# Patient Record
Sex: Female | Born: 2000 | Race: White | Hispanic: No | Marital: Single | Smoking: Never smoker
Health system: Southern US, Community
[De-identification: ages and names within clinical notes are randomized; demographics above are authoritative.]

## PROBLEM LIST (undated history)

## (undated) DIAGNOSIS — R55 Syncope and collapse: Secondary | ICD-10-CM

## (undated) HISTORY — PX: FOOT SURGERY: SHX648

## (undated) HISTORY — DX: Syncope and collapse: R55

---
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
## 2022-01-15 ENCOUNTER — Emergency Department
Admission: EM | Admit: 2022-01-15 | Discharge: 2022-01-15 | Disposition: A | Discharge status: Home / Self Care | Payer: 59 | Attending: Emergency Medicine | Admitting: Emergency Medicine

## 2022-01-15 ENCOUNTER — Other Ambulatory Visit: Payer: Self-pay

## 2022-01-15 ENCOUNTER — Encounter: Payer: Self-pay | Admitting: Emergency Medicine

## 2022-01-15 DIAGNOSIS — F10929 Alcohol use, unspecified with intoxication, unspecified: Secondary | ICD-10-CM

## 2022-01-15 DIAGNOSIS — F10120 Alcohol abuse with intoxication, uncomplicated: Secondary | ICD-10-CM | POA: Insufficient documentation

## 2022-01-15 DIAGNOSIS — Y908 Blood alcohol level of 240 mg/100 ml or more: Secondary | ICD-10-CM | POA: Diagnosis not present

## 2022-01-15 LAB — CBC
HCT: 37.5 % (ref 36.0–46.0)
Hemoglobin: 12.5 g/dL (ref 12.0–15.0)
MCH: 28.2 pg (ref 26.0–34.0)
MCHC: 33.3 g/dL (ref 30.0–36.0)
MCV: 84.5 fL (ref 80.0–100.0)
Platelets: 388 10*3/uL (ref 150–400)
RBC: 4.44 MIL/uL (ref 3.87–5.11)
RDW: 12 % (ref 11.5–15.5)
WBC: 9.3 10*3/uL (ref 4.0–10.5)
nRBC: 0 % (ref 0.0–0.2)

## 2022-01-15 LAB — COMPREHENSIVE METABOLIC PANEL
ALT: 18 U/L (ref 0–44)
AST: 20 U/L (ref 15–41)
Albumin: 4.2 g/dL (ref 3.5–5.0)
Alkaline Phosphatase: 51 U/L (ref 38–126)
Anion gap: 9 (ref 5–15)
BUN: 11 mg/dL (ref 6–20)
CO2: 22 mmol/L (ref 22–32)
Calcium: 8.4 mg/dL — ABNORMAL LOW (ref 8.9–10.3)
Chloride: 112 mmol/L — ABNORMAL HIGH (ref 98–111)
Creatinine, Ser: 0.69 mg/dL (ref 0.44–1.00)
GFR, Estimated: >60 mL/min (ref >60)
Glucose, Bld: 95 mg/dL (ref 70–99)
Potassium: 3.6 mmol/L (ref 3.5–5.1)
Sodium: 143 mmol/L (ref 135–145)
Total Bilirubin: 0.3 mg/dL (ref 0.3–1.2)
Total Protein: 7.3 g/dL (ref 6.5–8.1)

## 2022-01-15 LAB — ETHANOL: Alcohol, Ethyl (B): 262 mg/dL — ABNORMAL HIGH (ref <10)

## 2022-01-15 NOTE — Discharge Instructions (Signed)
You were seen in the emergency department for alcohol intoxication.  Underage and/or binge drinking represents a very real risk to your health and your future.  If you find that you cannot stop drinking, consider talking with [facility] about any resources they may have available.  Never drive a vehicle or operate machinery while intoxicated.  Return to the emergency department if you develop new or worsening symptoms that concern you. ?

## 2022-01-15 NOTE — ED Notes (Signed)
Provider at bedside to evaluate.

## 2022-01-15 NOTE — ED Provider Notes (Signed)
? ?[facility] ?Provider Note ? ? ? Event Date/Time  ? First MD Initiated Contact with Patient 01/15/22 0245   ?  (approximate) ? ? ?History  ? ?Alcohol Intoxication ? ? ?HPI ? ?Michelle Wiley is a 21 y.o. female with no contributory chronic medical history who is currently a Consulting civil engineer at [employer].  She presents by private vehicle for evaluation of alcohol intoxication.  The patient is awake and alert and oriented.  She reports that she was at a bar on campus tonight drinking.  She laughs and says she drank "way too much".  She then began to vomit, so her roommate became concerned and brought her to the emergency department.  She said that she goes out regularly but does not always drink heavily.  She says she feels fine now and is no longer nauseated.  She is awake, alert, oriented, and in no distress.  No falls or other injuries. ?  ? ? ?Physical Exam  ? ?Triage Vital Signs: ?ED Triage Vitals  ?Enc Vitals Group  ?   BP 01/15/22 0216 129/84  ?   Pulse Rate 01/15/22 0216 93  ?   Resp 01/15/22 0216 16  ?   Temp 01/15/22 0216 98.8 ?F (37.1 ?C)  ?   Temp Source 01/15/22 0216 Oral  ?   SpO2 01/15/22 0216 99 %  ?   Weight 01/15/22 0217 54.4 kg (120 lb)  ?   Height 01/15/22 0217 1.626 m (5\' 4" )  ?   Head Circumference --   ?   Peak Flow --   ?   Pain Score 01/15/22 0217 0  ?   Pain Loc --   ?   Pain Edu? --   ?   Excl. in [place]? --   ? ? ?Most recent vital signs: ?Vitals:  ? 01/15/22 0430 01/15/22 0500  ?BP:  115/71  ?Pulse: 95 (!) 109  ?Resp: 16 16  ?Temp:    ?SpO2: 100% 100%  ? ? ? ?General: Awake, no distress.  ?CV:  Good peripheral perfusion.  ?Resp:  Normal effort.  ?Abd:  No distention.  ?Other:  Seems clinically sober in spite of the history of alcohol intoxication. ? ? ?ED Results / Procedures / Treatments  ? ?Labs ?(all labs ordered are listed, but only abnormal results are displayed) ?Labs Reviewed  ?COMPREHENSIVE METABOLIC PANEL - Abnormal; Notable for the following components:  ?    Result Value  ?  Chloride 112 (*)   ? Calcium 8.4 (*)   ? All other components within normal limits  ?ETHANOL - Abnormal; Notable for the following components:  ? Alcohol, Ethyl (B) 262 (*)   ? All other components within normal limits  ?CBC  ? ? ? ? ?IMPRESSION / MDM / ASSESSMENT AND PLAN / ED COURSE  ?I reviewed the triage vital signs and the nursing notes. ?             ?               ? ?Differential diagnosis includes, but is not limited to, alcohol intoxication, binge drinking, electrolyte or metabolic abnormality, aspiration. ? ?Patient is in no distress with stable vital signs.  She is laughing about the situation.  Both her ED nurse and I stressed to her the severity of the situation, the fact that she is drinking under age, and the serious consequences to her life and health that can result from under her age and binge drinking.  She states "I have heard the stories."   ? ?Labs ordered in triage include CMP, ethanol level, and CBC.  I reviewed the results and her CMP is essentially normal, CBC is normal, and her ethanol level is elevated at 262. ? ?In spite of her alcohol intoxication, there is no sign of emergent medical condition.  She is in no distress and not in imminent danger of loss of airway.  I will observe her for period of time but she does not require additional medical intervention.  She is already expressing her desire to leave but I will observe her for a time to make sure she does not fall asleep and present an aspiration or respiratory danger to herself. ? ? ? ? ?Clinical Course as of 01/15/22 0646  ?Sun Jan 15, 2022  ?0346 Alcohol, Ethyl (B)(!): 262 [CF]  ?1062 Patient remains awake, alert, and in good spirits.  She has been stable for 3 hours with no sign of somnolence nor any additional vomiting.  Discharging for outpatient follow-up. [CF]  ?  ?Clinical Course User Index ?[CF] Loleta Rose, MD  ? ? ? ?FINAL CLINICAL IMPRESSION(S) / ED DIAGNOSES  ? ?Final diagnoses:  ?Alcoholic intoxication with  complication (HCC)  ? ? ? ?Rx / DC Orders  ? ?ED Discharge Orders   ? ? None  ? ?  ? ? ? ?Note:  This document was prepared using Dragon voice recognition software and may include unintentional dictation errors. ?  ?Loleta Rose, MD ?01/15/22 [phone] ? ?

## 2022-01-15 NOTE — ED Notes (Signed)
Patient resting comfortably on stretcher. RR even and unlabored. Patient verbalizes no complaints or needs at this time. Snacks and beverages provided to patient and friend at bedside. ?

## 2022-01-15 NOTE — ED Notes (Signed)
Patient states that  she went to a campus bar tonight and drank a lot. She then went back to her dorm where she had N/V multiple times. Patient states she usually drinks every weekend and some during the week. She denies drug use. This nurse explained to the patient the risks of drinking. ?

## 2022-01-15 NOTE — ED Triage Notes (Signed)
Pt reports she went to a bar and drank alcohol, pt reports she vomited in her room and her roommate brought her to ER for evaluation  ?

## 2022-01-15 NOTE — ED Notes (Signed)
Patient resting comfortably on stretcher in room. RR even and unlabored. Patient verbalizes no needs or complaints. Patient's friend remains at bedside. ?

## 2022-01-15 NOTE — ED Notes (Signed)
Discharge instructions reviewed with patient and her friend. Patient verbalized understanding. Patient given socks since she didn't wear any shoes. Patient ambulated with a steady gait out to the parking lot.  ?

## 2022-07-10 ENCOUNTER — Ambulatory Visit
Admission: RE | Admit: 2022-07-10 | Discharge: 2022-07-10 | Disposition: A | Discharge status: Home / Self Care | Payer: 59 | Source: Ambulatory Visit | Attending: Urgent Care | Admitting: Urgent Care

## 2022-07-10 VITALS — BP 135/79 | HR 81 | Temp 98.1°F | Resp 19

## 2022-07-10 DIAGNOSIS — N898 Other specified noninflammatory disorders of vagina: Secondary | ICD-10-CM

## 2022-07-10 DIAGNOSIS — H6690 Otitis media, unspecified, unspecified ear: Secondary | ICD-10-CM

## 2022-07-10 HISTORY — DX: Otitis media, unspecified, unspecified ear: H66.90

## 2022-07-10 LAB — POCT URINALYSIS DIP (MANUAL ENTRY)
Bilirubin, UA: NEGATIVE
Blood, UA: NEGATIVE
Glucose, UA: NEGATIVE mg/dL
Ketones, POC UA: NEGATIVE mg/dL
Leukocytes, UA: NEGATIVE
Nitrite, UA: NEGATIVE
Protein Ur, POC: NEGATIVE mg/dL
Spec Grav, UA: 1.01 (ref 1.010–1.025)
Urobilinogen, UA: 0.2 E.U./dL
pH, UA: 6.5 (ref 5.0–8.0)

## 2022-07-10 NOTE — ED Provider Notes (Addendum)
Michelle Wiley    CSN: [id number] Arrival date & time: 07/10/22  1751      History   Chief Complaint Chief Complaint  Patient presents with   Vaginal Itching    Burning and itching - Entered by patient   Dysuria    HPI Michelle Wiley is a 21 y.o. female.    Vaginal Itching  Dysuria   Presents to urgent care with complaint of symptoms x3 days including vaginal itching and burning.  Patient requests STD screening as well as for UTI.  Denies fever.  Endorses some abdominal cramping.  Denies discharge.  History reviewed. No pertinent past medical history.  Patient Active Problem List   Diagnosis Date Noted   Otitis media 07/10/2022   Hepatitis B immune 12/29/2020   Plantar wart 12/24/2015    History reviewed. No pertinent surgical history.  OB History   No obstetric history on file.      Home Medications    Prior to Admission medications   Medication Sig Start Date End Date Taking? Authorizing Provider  acetaminophen-codeine (TYLENOL/CODEINE #3) 300-30 MG tablet Take by mouth. 03/12/17  Yes [provider]  clindamycin (CLEOCIN) 300 MG capsule TAKE TWO CAPSULES BY MOUTH EVERY 8 HOURS FOR 7 DAYS 05/27/17  Yes [provider]  clotrimazole (LOTRIMIN) 1 % cream Apply topically. 06/16/19  Yes [provider]  diclofenac Sodium (VOLTAREN) 1 % GEL Apply topically. 07/13/17  Yes [provider]  Docusate Sodium (DSS) 100 MG CAPS Take by mouth. 03/12/17  Yes [provider]  ondansetron (ZOFRAN-ODT) 4 MG disintegrating tablet Take by mouth. 03/12/17  Yes [provider]  Sulfacetamide Sodium-Sulfur 10-5 % SUSP Use as wash once or twice daily as directed. 11/07/17  Yes [provider]  tretinoin (RETIN-A) 0.05 % cream Apply to face qhs 05/30/16  Yes [provider]  tretinoin microspheres (RETIN-A MICRO) 0.04 % gel Apply to affected area nightly 11/07/17  Yes [provider]  albuterol (VENTOLIN  HFA) 108 (90 Base) MCG/ACT inhaler Inhale 1-2 puffs into the lungs every 6 (six) hours as needed (cough). 06/19/21   Boddu, Kristin, FNP  LO LOESTRIN FE 1 MG-10 MCG / 10 MCG tablet Take 1 tablet by mouth daily. 05/12/22   [provider]  naproxen (NAPROSYN) 375 MG tablet Take 1 tablet (375 mg total) by mouth 2 (two) times daily. 11/19/21   Bing Neighbors, FNP  tiZANidine (ZANAFLEX) 2 MG tablet Take 1 tablet (2 mg total) by mouth every 8 (eight) hours as needed for muscle spasms. 11/19/21   Bing Neighbors, FNP    Family History History reviewed. No pertinent family history.  Social History Social History   Tobacco Use   Smoking status: Never   Smokeless tobacco: Never  Vaping Use   Vaping Use: Never used  Substance Use Topics   Alcohol use: Not Currently   Drug use: Not Currently     Allergies   Patient has no known allergies.   Review of Systems Review of Systems  Genitourinary:  Positive for dysuria.     Physical Exam Triage Vital Signs ED Triage Vitals  Enc Vitals Group     BP 07/10/22 1814 135/79     Pulse Rate 07/10/22 1814 81     Resp 07/10/22 1814 19     Temp 07/10/22 1814 98.1 F (36.7 C)     Temp src --      SpO2 07/10/22 1814 98 %     Weight --  Height --      Head Circumference --      Peak Flow --      Pain Score 07/10/22 1815 5     Pain Loc --      Pain Edu? --      Excl. in [place]? --    No data found.  Updated Vital Signs BP 135/79   Pulse 81   Temp 98.1 F (36.7 C)   Resp 19   LMP 06/10/2022 (Approximate)   SpO2 98%   Visual Acuity Right Eye Distance:   Left Eye Distance:   Bilateral Distance:    Right Eye Near:   Left Eye Near:    Bilateral Near:     Physical Exam Vitals reviewed.  Constitutional:      Appearance: Normal appearance.  Skin:    General: Skin is warm and dry.  Neurological:     General: No focal deficit present.     Mental Status: She is alert and oriented to person, place, and time.   Psychiatric:        Mood and Affect: Mood normal.        Behavior: Behavior normal.      UC Treatments / Results  Labs (all labs ordered are listed, but only abnormal results are displayed) Labs Reviewed  POCT URINALYSIS DIP (MANUAL ENTRY)  CERVICOVAGINAL ANCILLARY ONLY    EKG   Radiology No results found.  Procedures Procedures (including critical care time)  Medications Ordered in UC Medications - No data to display  Initial Impression / Assessment and Plan / UC Course  I have reviewed the triage vital signs and the nursing notes.  Pertinent labs & imaging results that were available during my care of the patient were reviewed by me and considered in my medical decision making (see chart for details).   UA is negative.  Vaginal swab obtained and pending.   Final Clinical Impressions(s) / UC Diagnoses   Final diagnoses:  Vaginal itching   Discharge Instructions   None    ED Prescriptions   None    PDMP not reviewed this encounter.   Rose Phi, [place] 07/10/22 [place], [place] 07/10/22 [phone]

## 2022-07-10 NOTE — ED Triage Notes (Signed)
Pt. States for the past 3 days she has been experiencing Vaginal itching and burning. Pt. States she wants a std screening and to be tested for a UTI.

## 2022-07-10 NOTE — Discharge Instructions (Addendum)
Results of your vaginal swab will be posted to your MyChart account.  If there is a positive result, you will be contacted to arrange for treatment.

## 2022-07-11 ENCOUNTER — Telehealth (HOSPITAL_COMMUNITY): Payer: Self-pay | Admitting: Emergency Medicine

## 2022-07-11 LAB — CERVICOVAGINAL ANCILLARY ONLY
Bacterial Vaginitis (gardnerella): POSITIVE — AB
Candida Glabrata: NEGATIVE
Candida Vaginitis: NEGATIVE
Chlamydia: NEGATIVE
Comment: NEGATIVE
Comment: NEGATIVE
Comment: NEGATIVE
Comment: NEGATIVE
Comment: NEGATIVE
Comment: NORMAL
Neisseria Gonorrhea: NEGATIVE
Trichomonas: NEGATIVE

## 2022-07-11 MED ORDER — METRONIDAZOLE 500 MG PO TABS: 500.0000 mg | ORAL_TABLET | Freq: Two times a day (BID) | ORAL | 0 refills | Status: DC

## 2022-08-02 ENCOUNTER — Ambulatory Visit: Admit: 2022-08-02 | Discharge status: Against Medical Advice | Payer: 59

## 2022-08-03 ENCOUNTER — Ambulatory Visit (INDEPENDENT_AMBULATORY_CARE_PROVIDER_SITE_OTHER): Payer: 59 | Admitting: Adult Health

## 2022-08-03 ENCOUNTER — Other Ambulatory Visit: Payer: Self-pay

## 2022-08-03 VITALS — BP 119/70 | HR 77 | Temp 100.0°F | Ht 63.39 in | Wt 132.0 lb

## 2022-08-03 DIAGNOSIS — B349 Viral infection, unspecified: Secondary | ICD-10-CM

## 2022-08-03 DIAGNOSIS — J029 Acute pharyngitis, unspecified: Secondary | ICD-10-CM

## 2022-08-03 LAB — POCT RAPID STREP A (OFFICE): Rapid Strep A Screen: NEGATIVE

## 2022-08-03 NOTE — Progress Notes (Signed)
[place] [place], [place] [address] Phone: [phone] Fax: [phone]   Office Visit Note  Patient Name: Michelle Wiley  Date of [id number]  Med Rec number [id number]  Date of Service: 08/03/2022  Patient has no known allergies.  Chief Complaint  Patient presents with   sick     HPI  Patient reports about 10 days of congestion, and feeling like something is stuck in her throat.  She started taking sudafed.  One roommate and a friend have just tested positive for Strep.  So she is here to make sure she doesn't have it.   She describes headache, ear pain, congestion, sore throat,  and cough.  Denies fever or chills.   Current Medication:  Outpatient Encounter Medications as of 08/03/2022  Medication Sig   LO LOESTRIN FE 1 MG-10 MCG / 10 MCG tablet Take 1 tablet by mouth daily.   [DISCONTINUED] acetaminophen-codeine (TYLENOL/CODEINE #3) 300-30 MG tablet Take by mouth. (Patient not taking: Reported on 08/03/2022)   [DISCONTINUED] albuterol (VENTOLIN HFA) 108 (90 Base) MCG/ACT inhaler Inhale 1-2 puffs into the lungs every 6 (six) hours as needed (cough). (Patient not taking: Reported on 08/03/2022)   [DISCONTINUED] clindamycin (CLEOCIN) 300 MG capsule TAKE TWO CAPSULES BY MOUTH EVERY 8 HOURS FOR 7 DAYS (Patient not taking: Reported on 08/03/2022)   [DISCONTINUED] clotrimazole (LOTRIMIN) 1 % cream Apply topically. (Patient not taking: Reported on 08/03/2022)   [DISCONTINUED] diclofenac Sodium (VOLTAREN) 1 % GEL Apply topically. (Patient not taking: Reported on 08/03/2022)   [DISCONTINUED] Docusate Sodium (DSS) 100 MG CAPS Take by mouth. (Patient not taking: Reported on 08/03/2022)   [DISCONTINUED] metroNIDAZOLE (FLAGYL) 500 MG tablet Take 1 tablet (500 mg total) by mouth 2 (two) times daily. (Patient not taking: Reported on 08/03/2022)   [DISCONTINUED] naproxen (NAPROSYN) 375 MG tablet Take 1 tablet (375 mg total) by mouth 2 (two) times daily. (Patient not taking:  Reported on 08/03/2022)   [DISCONTINUED] ondansetron (ZOFRAN-ODT) 4 MG disintegrating tablet Take by mouth. (Patient not taking: Reported on 08/03/2022)   [DISCONTINUED] Sulfacetamide Sodium-Sulfur 10-5 % SUSP Use as wash once or twice daily as directed. (Patient not taking: Reported on 08/03/2022)   [DISCONTINUED] tiZANidine (ZANAFLEX) 2 MG tablet Take 1 tablet (2 mg total) by mouth every 8 (eight) hours as needed for muscle spasms. (Patient not taking: Reported on 08/03/2022)   [DISCONTINUED] tretinoin (RETIN-A) 0.05 % cream Apply to face qhs (Patient not taking: Reported on 08/03/2022)   [DISCONTINUED] tretinoin microspheres (RETIN-A MICRO) 0.04 % gel Apply to affected area nightly (Patient not taking: Reported on 08/03/2022)   No facility-administered encounter medications on file as of 08/03/2022.      Medical History: No past medical history on file.   Vital Signs: BP 119/70   Pulse 77   Temp 100 F (37.8 C) (Tympanic)   Ht 5' 3.39" (1.61 m)   Wt 132 lb (59.9 kg)   LMP 06/10/2022 (Approximate)   SpO2 99%   BMI 23.10 kg/m    Review of Systems  Constitutional:  Negative for chills, fatigue and fever.  HENT:  Positive for congestion, ear pain, rhinorrhea and sore throat.   Eyes:  Negative for pain and itching.  Respiratory:  Positive for cough.   Cardiovascular:  Negative for chest pain.  Gastrointestinal:  Negative for diarrhea, nausea and vomiting.  Neurological:  Positive for headaches.    Physical Exam Vitals and nursing note reviewed.  Constitutional:      Appearance: Normal appearance.  HENT:     Head: Normocephalic.     Right Ear: Tympanic membrane and ear canal normal.     Left Ear: Tympanic membrane and ear canal normal.     Nose: Nose normal.     Mouth/Throat:     Mouth: Mucous membranes are moist.  Eyes:     Pupils: Pupils are equal, round, and reactive to light.  Cardiovascular:     Rate and Rhythm: Normal rate.     Pulses: Normal pulses.  Pulmonary:      Effort: Pulmonary effort is normal.     Breath sounds: Normal breath sounds.  Musculoskeletal:     Cervical back: Normal range of motion.  Lymphadenopathy:     Cervical: No cervical adenopathy.  Neurological:     Mental Status: She is alert.    Results for orders placed or performed in visit on 08/03/22 (from the past 24 hour(s))  POCT rapid strep A     Status: Normal   Collection Time: 08/03/22  4:38 PM  Result Value Ref Range   Rapid Strep A Screen Negative Negative    Assessment/Plan: 1. Viral illness Rest, and drink plenty of water.  Use cough drops, gargle warm sal water or drink wam liquids (like tea with honey) as needed for cough/throat irritation.   Take over-the-counter medicines (such as Dayquil or Nyquil) as discussed at your visit to help manage your symptoms.  Send a MyChart message to the provider or schedule a return appointment as needed for new/worsening symptoms (especially shortness of breath or chest pain) or if symptoms not improving with recommended treatment over the next 5-7 days.      2. Sore throat - POCT rapid strep A     General Counseling: Ebba verbalizes understanding of the findings of todays visit and agrees with plan of treatment. I have discussed any further diagnostic evaluation that may be needed or ordered today. We also reviewed her medications today. she has been encouraged to call the office with any questions or concerns that should arise related to todays visit.   Orders Placed This Encounter  Procedures   POCT rapid strep A    No orders of the defined types were placed in this encounter.   Time spent:20 Minutes Time spent includes review of chart, medications, test results, and follow up plan with the patient.    Johnna Acosta AGNP-C Nurse Practitioner

## 2022-08-19 IMAGING — DX DG FOOT 2V*R*
2 series · 2 of 2 positions shown · non-contrast
Comparison: None.

CLINICAL DATA: Trauma, pain

EXAM:
RIGHT FOOT - 2 VIEW

[foot ap]
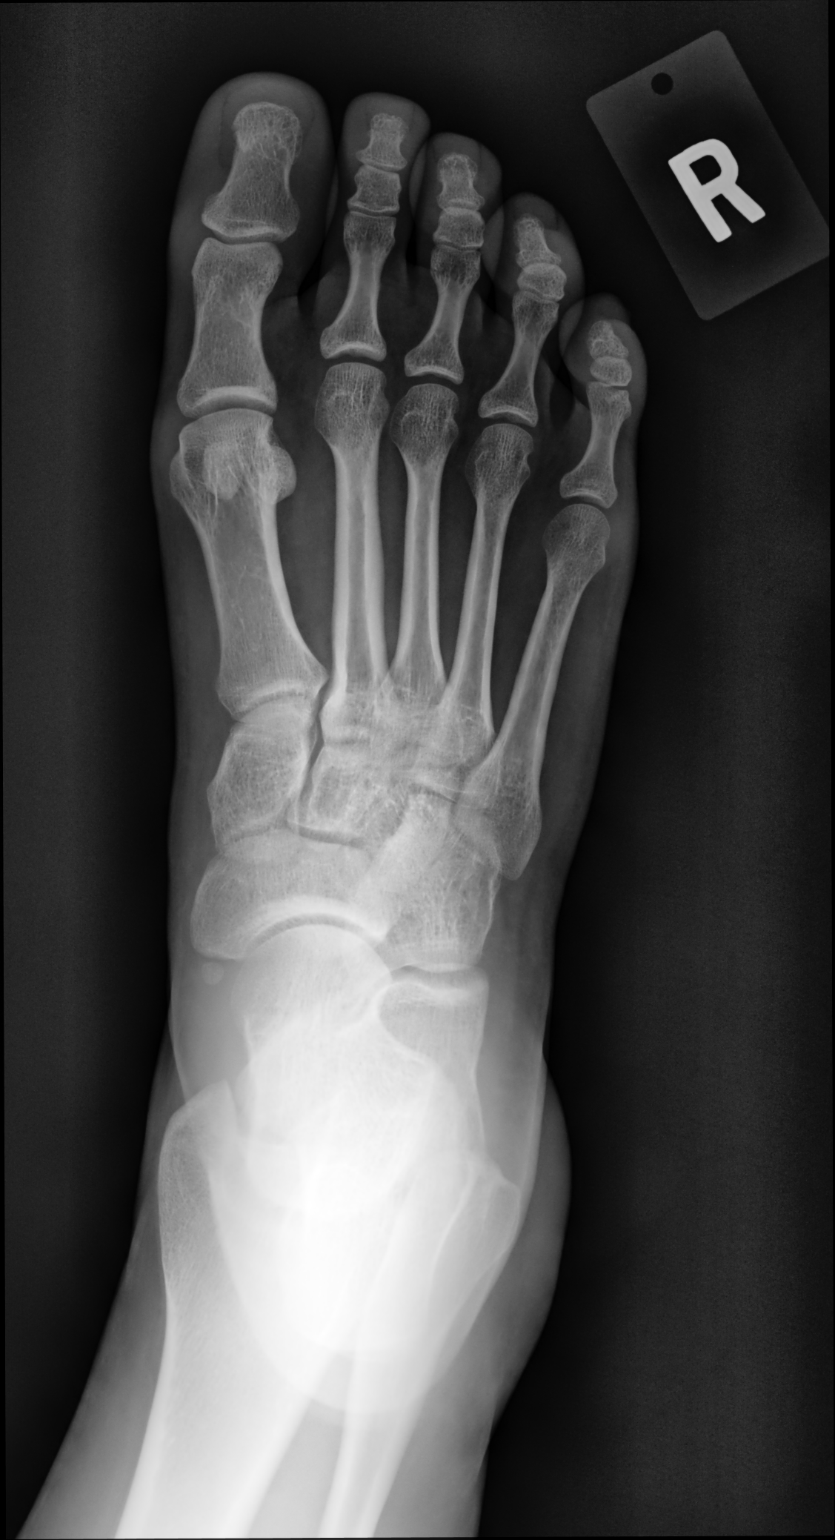

[foot lat]
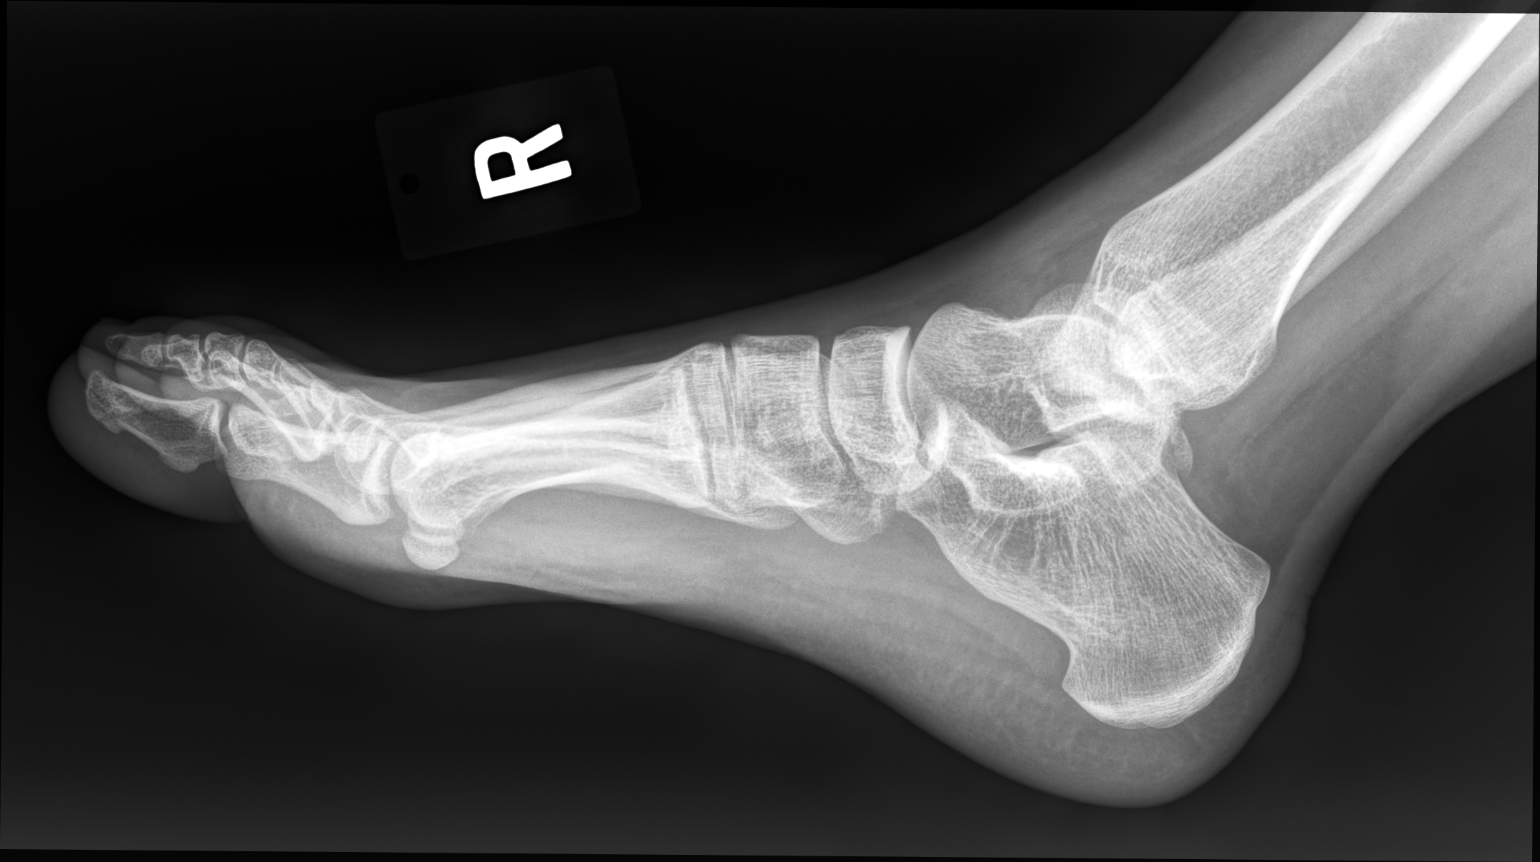

[2 of 2 positions shown; findings below may reference images not displayed]

FINDINGS: No recent fracture or dislocation is seen. There are no opaque
foreign bodies.
IMPRESSION: No fracture or dislocation is seen in the right foot.

## 2023-02-05 ENCOUNTER — Ambulatory Visit (INDEPENDENT_AMBULATORY_CARE_PROVIDER_SITE_OTHER): Payer: 59 | Admitting: Medical

## 2023-02-05 ENCOUNTER — Other Ambulatory Visit: Payer: Self-pay

## 2023-02-05 ENCOUNTER — Encounter: Payer: Self-pay | Admitting: Medical

## 2023-02-05 VITALS — BP 112/68 | HR 84 | Temp 97.6°F | Wt 136.0 lb

## 2023-02-05 DIAGNOSIS — Z113 Encounter for screening for infections with a predominantly sexual mode of transmission: Secondary | ICD-10-CM | POA: Diagnosis not present

## 2023-02-05 DIAGNOSIS — R300 Dysuria: Secondary | ICD-10-CM

## 2023-02-05 DIAGNOSIS — B3731 Acute candidiasis of vulva and vagina: Secondary | ICD-10-CM

## 2023-02-05 LAB — POCT WET PREP (WET MOUNT)
Clue Cells Wet Prep Whiff POC: NEGATIVE
Trichomonas Wet Prep HPF POC: ABSENT

## 2023-02-05 LAB — POCT URINALYSIS DIPSTICK (MANUAL)
Nitrite, UA: NEGATIVE
Poct Bilirubin: NEGATIVE
Poct Blood: 50 — AB
Poct Glucose: NORMAL mg/dL
Poct Ketones: NEGATIVE
Poct Urobilinogen: NORMAL mg/dL
Spec Grav, UA: 1.01 (ref 1.010–1.025)
pH, UA: 6 (ref 5.0–8.0)

## 2023-02-05 MED ORDER — FLUCONAZOLE 150 MG PO TABS: 150.0000 mg | ORAL_TABLET | Freq: Once | ORAL | 0 refills | Status: AC

## 2023-02-05 MED ORDER — NITROFURANTOIN MONOHYD MACRO 100 MG PO CAPS: 100.0000 mg | ORAL_CAPSULE | Freq: Two times a day (BID) | ORAL | 0 refills | Status: DC

## 2023-02-05 NOTE — Progress Notes (Signed)
[facility] [address] [place], [place] [address] Phone: [phone] Fax: [phone]   Office Visit Note  Patient Name: Michelle Wiley  Date of [id number]  Med Rec number [id number]  Date of Service: 02/05/2023  Allergies: Patient has no known allergies.  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  Yesterday, noted some itching/warmth/vaginal discomfort. No vaginal discharge or vaginal odor.  This morning, had pain/burning when she urinated. Urine also looked cloudy and had odor this AM. Slightly increased urinary frequency today, does have urgency. Some lower abdominal cramping yesterday and today, worse after she urinated this AM. No back pain, nausea, vomiting or fever. Some chills this AM.  Hx of BV and UTI. Last STI testing in fall was negative. Has not had new partner. Using condoms consistently for vaginal sex, not for oral.  Did urinate after sex this weekend.    Current Medication:  Outpatient Encounter Medications as of 02/05/2023  Medication Sig   LO LOESTRIN FE 1 MG-10 MCG / 10 MCG tablet Take 1 tablet by mouth daily.   No facility-administered encounter medications on file as of 02/05/2023.      Medical History: History reviewed. No pertinent past medical history.   Vital Signs: BP 112/68   Pulse 84   Temp 97.6 F (36.4 C) (Tympanic)   Wt 136 lb (61.7 kg)   SpO2 99%   BMI 23.80 kg/m    Review of Systems See HPI  Physical Exam Vitals reviewed.  Constitutional:      General: She is not in acute distress.    Appearance: She is not ill-appearing.  Abdominal:     Palpations: Abdomen is soft.     Tenderness: There is no right CVA tenderness or left CVA tenderness.     Comments: No suprapubic tenderness.  Genitourinary:    Comments: Moderate erythema to labia minora and vaginal mucosa.  Small thicker white/beige vaginal discharge.  No genital lesions  or sores. Neurological:     Mental Status: She is alert.    Results for orders placed or performed in visit on 02/05/23 (from the past 24 hour(s))  POCT Urinalysis Dip Manual     Status: Abnormal   Collection Time: 02/05/23 10:58 AM  Result Value Ref Range   Spec Grav, UA 1.010 1.010 - 1.025   pH, UA 6.0 5.0 - 8.0   Leukocytes, UA Moderate (2+) (A) Negative   Nitrite, UA Negative Negative   Poct Protein trace Negative, trace mg/dL   Poct Glucose Normal Normal mg/dL   Poct Ketones Negative Negative   Poct Urobilinogen Normal Normal mg/dL   Poct Bilirubin Negative Negative   Poct Blood =50 (A) Negative, trace  POCT Wet Prep [facility] [place])     Status: Abnormal   Collection Time: 02/05/23 11:07 AM  Result Value Ref Range   Source Wet Prep POC vaginal    WBC, Wet Prep HPF POC few    Bacteria Wet Prep HPF POC Few Few   BACTERIA WET PREP MORPHOLOGY POC cocci    Clue Cells Wet Prep HPF POC None None   Clue Cells Wet Prep Whiff POC Negative Whiff    Yeast Wet Prep HPF POC Moderate (A) None   KOH Wet Prep POC Moderate (A) None   Trichomonas Wet Prep HPF POC Absent Absent      Assessment/Plan: 1. Dysuria 2. Candidiasis of vulva and vagina 3. Screening  examination for STD (sexually transmitted disease) Clinical findings consistent with vulvovaginal candidiasis.  Also some suspicion for urinary tract infection.  Will start empiric antibiotic for UTI and send urine for culture.  Will also give fluconazole for yeast infection.  Vaginal swab sent for STI testing.  - POCT Urinalysis Dip Manual - POCT Wet Prep [facility]) - Urine Culture - nitrofurantoin, macrocrystal-monohydrate, (MACROBID) 100 MG capsule; Take 1 capsule (100 mg total) by mouth 2 (two) times daily for 5 days.  Dispense: 10 capsule; Refill: 0 - fluconazole (DIFLUCAN) 150 MG tablet; Take 1 tablet (150 mg total) by mouth once for 1 dose. Take second dose after 4-5 days.  Dispense: 2 tablet; Refill: 0  Patient Instructions:   -Take Nitrofurantoin every 12 hours (twice a day) with food for 5 days; Finish all antibiotics. -Take Fluconazole one dose today, second dose in 4-5 days. -Drink plenty of water. Avoid or limit alcohol and caffeine, which may make symptoms worse. -You may take an over-the-counter pain reliever (i.e., AZO urinary pain relief, Tylenol) as needed for pain next 1-2 days.  -You will receive a MyChart message notifying you of your lab results when they are available.  -Send secure message to provider or schedule return appointment as needed for new/worsening symptoms (such as fever or abdominal pain) or if your symptoms are not improving after 2-3 days taking medications.       General Counseling: Harmani verbalizes understanding of the findings of todays visit and agrees with plan of treatment. she has been encouraged to call the office with any questions or concerns that should arise related to todays visit.   Orders Placed This Encounter  Procedures   Chlamydia/Gonococcus/Trichomonas, NAA   Urine Culture   POCT Urinalysis Dip Manual   POCT Wet Prep ([place])    Meds ordered this encounter  Medications   fluconazole (DIFLUCAN) 150 MG tablet    Sig: Take 1 tablet (150 mg total) by mouth once for 1 dose. Take second dose after 4-5 days.    Dispense:  2 tablet    Refill:  0    Order Specific Question:   Supervising Provider    Answer:   Noralee Stain [[id number]]   nitrofurantoin, macrocrystal-monohydrate, (MACROBID) 100 MG capsule    Sig: Take 1 capsule (100 mg total) by mouth 2 (two) times daily for 5 days.    Dispense:  10 capsule    Refill:  0    Order Specific Question:   Supervising Provider    Answer:   Noralee Stain [[id number]]    Time spent:20 Minutes    Jonathon Resides PA-C [facility] 02/05/2023 10:36 AM

## 2023-02-05 NOTE — Patient Instructions (Signed)
-  Take Nitrofurantoin every 12 hours (twice a day) with food for 5 days; Finish all antibiotics. -Take Fluconazole one dose today, second dose in 4-5 days. -Drink plenty of water. Avoid or limit alcohol and caffeine, which may make symptoms worse. -You may take an over-the-counter pain reliever (i.e., AZO urinary pain relief, Tylenol) as needed for pain next 1-2 days.  -You will receive a MyChart message notifying you of your lab results when they are available.  -Send secure message to provider or schedule return appointment as needed for new/worsening symptoms (such as fever or abdominal pain) or if your symptoms are not improving after 2-3 days taking medications.

## 2023-02-07 ENCOUNTER — Emergency Department
Admission: EM | Admit: 2023-02-07 | Discharge: 2023-02-07 | Disposition: A | Discharge status: Home / Self Care | Payer: 59 | Attending: Emergency Medicine | Admitting: Emergency Medicine

## 2023-02-07 ENCOUNTER — Other Ambulatory Visit: Payer: Self-pay

## 2023-02-07 ENCOUNTER — Emergency Department: Payer: 59

## 2023-02-07 DIAGNOSIS — T7840XA Allergy, unspecified, initial encounter: Secondary | ICD-10-CM | POA: Insufficient documentation

## 2023-02-07 LAB — URINALYSIS, ROUTINE W REFLEX MICROSCOPIC
Bilirubin Urine: NEGATIVE
Glucose, UA: NEGATIVE mg/dL
Hgb urine dipstick: NEGATIVE
Ketones, ur: NEGATIVE mg/dL
Leukocytes,Ua: NEGATIVE
Nitrite: NEGATIVE
Protein, ur: NEGATIVE mg/dL
Specific Gravity, Urine: 1.005 (ref 1.005–1.030)
pH: 7 (ref 5.0–8.0)

## 2023-02-07 LAB — CBC
HCT: 40.6 % (ref 36.0–46.0)
Hemoglobin: 13.7 g/dL (ref 12.0–15.0)
MCH: 29.9 pg (ref 26.0–34.0)
MCHC: 33.7 g/dL (ref 30.0–36.0)
MCV: 88.6 fL (ref 80.0–100.0)
Platelets: 274 10*3/uL (ref 150–400)
RBC: 4.58 MIL/uL (ref 3.87–5.11)
RDW: 12.1 % (ref 11.5–15.5)
WBC: 7.4 10*3/uL (ref 4.0–10.5)
nRBC: 0 % (ref 0.0–0.2)

## 2023-02-07 LAB — BASIC METABOLIC PANEL
Anion gap: 8 (ref 5–15)
BUN: 8 mg/dL (ref 6–20)
CO2: 22 mmol/L (ref 22–32)
Calcium: 9 mg/dL (ref 8.9–10.3)
Chloride: 108 mmol/L (ref 98–111)
Creatinine, Ser: 0.62 mg/dL (ref 0.44–1.00)
GFR, Estimated: >60 mL/min (ref >60)
Glucose, Bld: 116 mg/dL — ABNORMAL HIGH (ref 70–99)
Potassium: 3.4 mmol/L — ABNORMAL LOW (ref 3.5–5.1)
Sodium: 138 mmol/L (ref 135–145)

## 2023-02-07 LAB — CHLAMYDIA/GONOCOCCUS/TRICHOMONAS, NAA
Chlamydia by NAA: NEGATIVE
Gonococcus by NAA: NEGATIVE
Trich vag by NAA: NEGATIVE

## 2023-02-07 LAB — PREGNANCY, URINE: Preg Test, Ur: NEGATIVE

## 2023-02-07 LAB — D-DIMER, QUANTITATIVE: D-Dimer, Quant: 0.4 ug/mL-FEU (ref 0.00–0.50)

## 2023-02-07 MED ORDER — METHYLPREDNISOLONE SODIUM SUCC 125 MG IJ SOLR
125.0000 mg | Freq: Once | INTRAMUSCULAR | Status: AC
  Administered 2023-02-07: 125 mg via INTRAVENOUS
  Filled 2023-02-07: qty 2

## 2023-02-07 MED ORDER — METHYLPREDNISOLONE 4 MG PO TBPK: ORAL_TABLET | ORAL | 0 refills | Status: DC

## 2023-02-07 MED ORDER — SODIUM CHLORIDE 0.9 % IV BOLUS
1000.0000 mL | Freq: Once | INTRAVENOUS | Status: AC
  Administered 2023-02-07: 1000 mL via INTRAVENOUS

## 2023-02-07 MED ORDER — FAMOTIDINE IN NACL 20-0.9 MG/50ML-% IV SOLN
20.0000 mg | Freq: Once | INTRAVENOUS | Status: AC
  Administered 2023-02-07: 20 mg via INTRAVENOUS
  Filled 2023-02-07: qty 50

## 2023-02-07 MED ORDER — MECLIZINE HCL 25 MG PO TABS
25.0000 mg | ORAL_TABLET | Freq: Once | ORAL | Status: AC
  Administered 2023-02-07: 25 mg via ORAL
  Filled 2023-02-07: qty 1

## 2023-02-07 NOTE — ED Notes (Signed)
Pt presents to ED with c/o dizziness and chest tightness that started 30 min ago while pt was laying on the couch. Pt states currently being Tx'ed for UTI and is taking macrobid.NAD noted. Pt is A&Ox4. Pt ambulatory with steady gait.

## 2023-02-07 NOTE — ED Provider Notes (Addendum)
[facility] Provider Note    Event Date/Time   First MD Initiated Contact with Patient 02/07/23 1749     (approximate)   History   No chief complaint on file.   HPI  Michelle Wiley is a 22 y.o. female with recent UTI and yeast infection presents emergency department stating that she had sudden onset of dizziness and some chest tightness along with fever earlier today.  States she was just lying on the couch when it happened.  Patient is on birth control.  She was recently placed on Macrobid for UTI.      Physical Exam   Triage Vital Signs: ED Triage Vitals  Enc Vitals Group     BP 02/07/23 1645 (!) 152/87     Pulse Rate 02/07/23 1645 76     Resp 02/07/23 1645 18     Temp 02/07/23 1645 98 F (36.7 C)     Temp Source 02/07/23 1645 Oral     SpO2 02/07/23 1645 99 %     Weight --      Height --      Head Circumference --      Peak Flow --      Pain Score 02/07/23 1643 3     Pain Loc --      Pain Edu? --      Excl. in [place]? --     Most recent vital signs: Vitals:   02/07/23 1645  BP: (!) 152/87  Pulse: 76  Resp: 18  Temp: 98 F (36.7 C)  SpO2: 99%     General: Awake, no distress.   CV:  Good peripheral perfusion. regular rate and  rhythm Resp:  Normal effort. Lungs CTA Abd:  No distention.   Other:  PERRL, EOMI, minimal nystagmus noted, neck is supple, no lymphadenopathy   ED Results / Procedures / Treatments   Labs (all labs ordered are listed, but only abnormal results are displayed) Labs Reviewed  BASIC METABOLIC PANEL - Abnormal; Notable for the following components:      Result Value   Potassium 3.4 (*)    Glucose, Bld 116 (*)    All other components within normal limits  URINALYSIS, ROUTINE W REFLEX MICROSCOPIC - Abnormal; Notable for the following components:   Color, Urine STRAW (*)    APPearance CLEAR (*)    All other components within normal limits  CBC  PREGNANCY, URINE  D-DIMER, QUANTITATIVE     EKG  EKG is  normal sinus rhythm   RADIOLOGY Chest x-ray    PROCEDURES:   Procedures   MEDICATIONS ORDERED IN ED: Medications  methylPREDNISolone sodium succinate (SOLU-MEDROL) 125 mg/2 mL injection 125 mg (has no administration in time range)  famotidine (PEPCID) IVPB 20 mg premix (has no administration in time range)  meclizine (ANTIVERT) tablet 25 mg (25 mg Oral Given 02/07/23 1918)  sodium chloride 0.9 % bolus 1,000 mL (1,000 mLs Intravenous New Bag/Given 02/07/23 1927)     IMPRESSION / MDM / ASSESSMENT AND PLAN / ED COURSE  I reviewed the triage vital signs and the nursing notes.                              Differential diagnosis includes, but is not limited to, dehydration, anxiety, vertigo, PE, CAP, allergic reaction to [facility]  Patient's presentation is most consistent with acute presentation with potential threat to life or bodily function.   Due to the  sudden onset of chest pain/tightness and dizziness will evaluate the patient for PE with a D-dimer, if D-dimer is elevated we will progress to a CTA for PE  In the meantime patient will be given fluids to see if this helps with the dizziness, meclizine for dizziness,  Labs are reassuring, D-dimer is pending  EKG shows normal sinus rhythm, see physician read  Chest x-ray was independently reviewed and interpreted by me as being negative for any acute abnormality  D-dimer is reassuring   Patient's workup is reassuring, dizziness is, better with fluids and the medicine that we gave her, however do feel the chest tightness combined with taking a new medication will treat her for an allergic reaction to Macrobid.  With the D-dimer being negative, the patient does not appear to be short of breath I do not feel that a CTA is warranted at this time.  Patient was given strict instructions to return emergency department if worsening.  She was given a prescription for a Medrol Dosepak.  Continue to take Benadryl etc.   FINAL CLINICAL  IMPRESSION(S) / ED DIAGNOSES   Final diagnoses:  Allergic reaction, initial encounter     Rx / DC Orders   ED Discharge Orders          Ordered    methylPREDNISolone (MEDROL DOSEPAK) 4 MG TBPK tablet        02/07/23 1943             Note:  This document was prepared using Dragon voice recognition software and may include unintentional dictation errors.    Faythe Ghee, PA-C 02/07/23 1942    Faythe Ghee, PA-C 02/07/23 1943    Chesley Noon, MD 02/10/23 [id number]

## 2023-02-07 NOTE — Discharge Instructions (Signed)
Follow-up with your regular doctor if not improved in 3 days.  Return if worsening.  Take medication as prescribed.  Have any doctor that you go to know that you had allergic reaction to Macrobid, tell them you had chest tightness and dizziness

## 2023-02-08 LAB — URINE CULTURE

## 2023-02-12 ENCOUNTER — Encounter: Payer: Self-pay | Admitting: Medical

## 2023-12-26 ENCOUNTER — Emergency Department
Admission: EM | Admit: 2023-12-26 | Discharge: 2023-12-26 | Disposition: A | Discharge status: Home / Self Care | Attending: Emergency Medicine | Admitting: Emergency Medicine

## 2023-12-26 ENCOUNTER — Other Ambulatory Visit: Payer: Self-pay

## 2023-12-26 DIAGNOSIS — R55 Syncope and collapse: Secondary | ICD-10-CM | POA: Insufficient documentation

## 2023-12-26 DIAGNOSIS — R112 Nausea with vomiting, unspecified: Secondary | ICD-10-CM | POA: Insufficient documentation

## 2023-12-26 DIAGNOSIS — R42 Dizziness and giddiness: Secondary | ICD-10-CM | POA: Diagnosis not present

## 2023-12-26 LAB — URINALYSIS, ROUTINE W REFLEX MICROSCOPIC
Bilirubin Urine: NEGATIVE
Glucose, UA: NEGATIVE mg/dL
Hgb urine dipstick: NEGATIVE
Ketones, ur: NEGATIVE mg/dL
Nitrite: NEGATIVE
Protein, ur: NEGATIVE mg/dL
Specific Gravity, Urine: 1.016 (ref 1.005–1.030)
pH: 5 (ref 5.0–8.0)

## 2023-12-26 LAB — CBC
HCT: 41.4 % (ref 36.0–46.0)
Hemoglobin: 14.3 g/dL (ref 12.0–15.0)
MCH: 30.3 pg (ref 26.0–34.0)
MCHC: 34.5 g/dL (ref 30.0–36.0)
MCV: 87.7 fL (ref 80.0–100.0)
Platelets: 276 10*3/uL (ref 150–400)
RBC: 4.72 MIL/uL (ref 3.87–5.11)
RDW: 11.7 % (ref 11.5–15.5)
WBC: 10.7 10*3/uL — ABNORMAL HIGH (ref 4.0–10.5)
nRBC: 0 % (ref 0.0–0.2)

## 2023-12-26 LAB — BASIC METABOLIC PANEL
Anion gap: 8 (ref 5–15)
BUN: 12 mg/dL (ref 6–20)
CO2: 25 mmol/L (ref 22–32)
Calcium: 9.7 mg/dL (ref 8.9–10.3)
Chloride: 105 mmol/L (ref 98–111)
Creatinine, Ser: 0.91 mg/dL (ref 0.44–1.00)
GFR, Estimated: >60 mL/min (ref >60)
Glucose, Bld: 106 mg/dL — ABNORMAL HIGH (ref 70–99)
Potassium: 4.9 mmol/L (ref 3.5–5.1)
Sodium: 138 mmol/L (ref 135–145)

## 2023-12-26 LAB — CBG MONITORING, ED: Glucose-Capillary: 102 mg/dL — ABNORMAL HIGH (ref 70–99)

## 2023-12-26 LAB — POC URINE PREG, ED: Preg Test, Ur: NEGATIVE

## 2023-12-26 NOTE — ED Provider Notes (Signed)
 [facility] Provider Note    Event Date/Time   First MD Initiated Contact with Patient 12/26/23 1212     (approximate)   History   Chief Complaint Loss of Consciousness   HPI  Michelle Wiley is a 23 y.o. female with no significant past medical history who presents to the ED complaining of syncope.  Patient reports that just prior to arrival she was sitting on the couch when she began to feel lightheaded and dizzy.  She states that her vision started to go dark and she was able to lower herself to the floor, where she believes she briefly lost consciousness.  She denies any associated chest pain or shortness of breath, has felt slightly weak and shaky following the episode.  She states that she had a similar syncopal episode in November and then again 1 week ago, which was associated with some nausea and vomiting.  She has never been evaluated for this previously.     Physical Exam   Triage Vital Signs: ED Triage Vitals  Encounter Vitals Group     BP 12/26/23 1147 127/87     Systolic BP Percentile --      Diastolic BP Percentile --      Pulse Rate 12/26/23 1147 99     Resp 12/26/23 1147 18     Temp 12/26/23 1147 98.3 F (36.8 C)     Temp Source 12/26/23 1147 Oral     SpO2 12/26/23 1147 100 %     Weight 12/26/23 1148 135 lb (61.2 kg)     Height 12/26/23 1148 5\' 4"  (1.626 m)     Head Circumference --      Peak Flow --      Pain Score 12/26/23 1154 0     Pain Loc --      Pain Education --      Exclude from Growth Chart --     Most recent vital signs: Vitals:   12/26/23 1311 12/26/23 1313  BP: (!) 133/97   Pulse:  88  Resp:  (!) 32  Temp:    SpO2:  98%    Constitutional: Alert and oriented. Eyes: Conjunctivae are normal. Head: Atraumatic. Nose: No congestion/rhinnorhea. Mouth/Throat: Mucous membranes are moist.  Cardiovascular: Normal rate, regular rhythm. Grossly normal heart sounds.  2+ radial pulses bilaterally. Respiratory: Normal  respiratory effort.  No retractions. Lungs CTAB. Gastrointestinal: Soft and nontender. No distention. Musculoskeletal: No lower extremity tenderness nor edema.  Neurologic:  Normal speech and language. No gross focal neurologic deficits are appreciated.    ED Results / Procedures / Treatments   Labs (all labs ordered are listed, but only abnormal results are displayed) Labs Reviewed  BASIC METABOLIC PANEL - Abnormal; Notable for the following components:      Result Value   Glucose, Bld 106 (*)    All other components within normal limits  CBC - Abnormal; Notable for the following components:   WBC 10.7 (*)    All other components within normal limits  URINALYSIS, ROUTINE W REFLEX MICROSCOPIC - Abnormal; Notable for the following components:   Color, Urine YELLOW (*)    APPearance HAZY (*)    Leukocytes,Ua SMALL (*)    Bacteria, UA RARE (*)    All other components within normal limits  CBG MONITORING, ED - Abnormal; Notable for the following components:   Glucose-Capillary 102 (*)    All other components within normal limits  POC URINE PREG, ED  EKG  ED ECG REPORT I, Chesley Noon, the attending physician, personally viewed and interpreted this ECG.   Date: 12/26/2023  EKG Time: 12:04  Rate: 82  Rhythm: normal sinus rhythm  Axis: Normal  Intervals:none  ST&T Change: None  PROCEDURES:  Critical Care performed: No  Procedures   MEDICATIONS ORDERED IN ED: Medications - No data to display   IMPRESSION / MDM / ASSESSMENT AND PLAN / ED COURSE  I reviewed the triage vital signs and the nursing notes.                              23 y.o. female with no significant past medical history who presents to the ED complaining of syncopal episode today with similar episodes a few months ago and last week.  Patient's presentation is most consistent with acute presentation with potential threat to life or bodily function.  Differential diagnosis includes, but is not  limited to, ACS, arrhythmia, PE, vasovagal episode, orthostatic hypotension, dehydration, electrolyte abnormality, AKI.  Patient nontoxic-appearing and in no acute distress, vital signs are unremarkable.  EKG shows no evidence of arrhythmia or ischemia and low suspicion for cardiac etiology, symptoms sound most consistent with vasovagal episode.  Labs without significant anemia, leukocytosis, electrolyte abnormality, or AKI.  Pregnancy testing is negative. Patient ambulatory without difficulty here in the ED and appropriate for discharge home with cardiology follow-up as needed.  She was counseled to return to the ED for new or worsening symptoms.  Patient agrees with plan.      FINAL CLINICAL IMPRESSION(S) / ED DIAGNOSES   Final diagnoses:  Syncope and collapse     Rx / DC Orders   ED Discharge Orders          Ordered    Ambulatory referral to Cardiology        12/26/23 1351             Note:  This document was prepared using Dragon voice recognition software and may include unintentional dictation errors.   Chesley Noon, MD 12/26/23 1352

## 2023-12-26 NOTE — ED Notes (Addendum)
 Marland Kitchen

## 2023-12-26 NOTE — ED Notes (Signed)
 Pt reports that she has had a few syncopal episodes in the past week. States that today she was sitting on the couch and got up to get her water bottle refilled and started to feel hot and nauseated. Pt states she passed out and reports feeling like she could pass out again, states that she has eaten eggs and fried rice today, states that she is only on bcp's and denies any medical hx, reports having had 3 covid vaccines I the past, denies any pain at this time

## 2023-12-26 NOTE — ED Triage Notes (Signed)
 Pt to ED POV for syncopal episode at 1115 today. Pt states this happened before--11/24 and 1 week ago with near syncope. Today had full syncopal episode, felt dizzy, vision became dark. She lowered self to floor. States right now is not dizzy but feels weak and shaky. Speech is clear. CBG is 102.

## 2023-12-31 ENCOUNTER — Ambulatory Visit: Admitting: Physician Assistant

## 2023-12-31 ENCOUNTER — Other Ambulatory Visit: Payer: Self-pay

## 2023-12-31 VITALS — BP 130/70 | HR 80 | Temp 98.0°F | Resp 18

## 2023-12-31 DIAGNOSIS — R55 Syncope and collapse: Secondary | ICD-10-CM

## 2023-12-31 LAB — POCT URINALYSIS DIPSTICK (MANUAL)
Leukocytes, UA: NEGATIVE
Nitrite, UA: NEGATIVE
Poct Bilirubin: NEGATIVE
Poct Glucose: NORMAL mg/dL
Poct Ketones: NEGATIVE
Poct Protein: NEGATIVE mg/dL
Poct Urobilinogen: NORMAL mg/dL
Spec Grav, UA: 1.015 (ref 1.010–1.025)
pH, UA: 6 (ref 5.0–8.0)

## 2023-12-31 NOTE — Progress Notes (Signed)
 [facility] [address] [place], [place] [address] Phone: [phone] Fax: [phone]   Office Visit Note  Patient Name: Michelle Wiley  Date of [id number]  Med Rec number [id number]   Nitrofurantoin   Chief Complaint  Patient presents with   Loss of Consciousness   Was adopted so does not know her family history   Passed out a few times  First time passed out, [place]. She became shaky, broke into a sweat, turned pale, then passed out.  During spring break, she was at dinner with her family and started shaking and then turned pale - went to the bathroom, threw up, legs felt weak. Then her vision went out.  This past time, she got shaky and then when stood up felt like she was going to pass out so laid down on the ground. Thinks she passed out. Felt like she was confused when she opened her eyes  Went to emergency department and was referred to Cardiologist in [place].   On Saturday, started feeling very weak   Last time she passed out went to ER   1 cup of coffee each morning Drinks EtOH socially Works out four times per week with no symptoms   Sleeps very well Tested for sleep apnea - does not have according to her recent testing   Menstrual cycles normal - four days   Current Medication:  Outpatient Encounter Medications as of 12/31/2023  Medication Sig   LO LOESTRIN FE 1 MG-10 MCG / 10 MCG tablet Take 1 tablet by mouth daily.   [DISCONTINUED] methylPREDNISolone  (MEDROL  DOSEPAK) 4 MG TBPK tablet Take 6 pills on day one then decrease by 1 pill each day   No facility-administered encounter medications on file as of 12/31/2023.      Medical History: Past Medical History:  Diagnosis Date   Hepatitis B immune 12/29/2020   Formatting of this note might be different from the original.  Titers drawn 10/12/2004     Otitis media 07/10/2022   Plantar wart 12/24/2015   Syncope and collapse      Vital Signs: BP 130/70   Pulse 80   Temp 98  F (36.7 C) (Tympanic)   Resp 18   LMP 12/19/2023 (Approximate)   SpO2 98%    ROS negative unless otherwise indicated above.  Physical Exam Vitals reviewed.  HENT:     Right Ear: Ear canal and external ear normal. No laceration, drainage, swelling or tenderness. There is no impacted cerumen. No foreign body. Tympanic membrane is not injected, perforated or erythematous.     Left Ear: Ear canal and external ear normal. No laceration, drainage, swelling or tenderness. There is no impacted cerumen. No foreign body. Tympanic membrane is not injected, perforated or erythematous.     Nose: Nose normal. No mucosal edema, congestion or rhinorrhea.     Right Nostril: No epistaxis.     Left Nostril: No epistaxis.     Right Turbinates: Not enlarged or swollen.     Left Turbinates: Not enlarged or swollen.     Right Sinus: No maxillary sinus tenderness or frontal sinus tenderness.     Left Sinus: No maxillary sinus tenderness or frontal sinus tenderness.     Mouth/Throat:     Pharynx: No oropharyngeal exudate or posterior oropharyngeal erythema.     Tonsils: No tonsillar exudate or tonsillar abscesses. 2+ on the right. 2+ on the left.  Eyes:     Pupils: Pupils are equal, round, and reactive to  light.  Cardiovascular:     Rate and Rhythm: Normal rate and regular rhythm.     Heart sounds: Normal heart sounds. No murmur heard.    No friction rub.  Pulmonary:     Effort: Pulmonary effort is normal. No respiratory distress.     Breath sounds: No stridor. No wheezing, rhonchi or rales.  Musculoskeletal:     Cervical back: No tenderness.  Lymphadenopathy:     Cervical: No cervical adenopathy.  Neurological:     Mental Status: She is alert.  Psychiatric:        Behavior: Behavior normal.     No results found for this or any previous visit (from the past 24 hours).    Assessment/Plan:  1. Syncope, unspecified syncope type (Primary) - POCT Urinalysis Dip Manual - CBC w/Diff - Iron, TIBC  and Ferritin Panel - TSH + free T4 - Hemoglobin A1c - Magnesium - Comp Met (CMET) - Sedimentation rate - Antinuclear Antib (ANA) - Urine Culture - HgB A1c - Iron and TIBC([facility]/Sunquest)   Cardiac exam reassuring Consider vasovagal etiology, as discussed today Will check labs   General Counseling: [name] verbalizes understanding of the findings of todays visit and agrees with plan of treatment.   she has been encouraged to call the office with any questions or concerns that should arise related to todays visit.  Total time spent with patient today 20 minutes. This includes reviewing records, evaluating the patient, and coordinating care. Face-to-face time >50%.    Orders Placed This Encounter  Procedures   Urine Culture   CBC w/Diff   Iron, TIBC and Ferritin Panel   TSH + free T4   Hemoglobin A1c   Magnesium   Comp Met (CMET)   Sedimentation rate   Antinuclear Antib (ANA)   HgB A1c   Iron and TIBC([facility]/Sunquest)   Iron and TIBC   Hemoglobin A1c   POCT Urinalysis Dip Manual    No orders of the defined types were placed in this encounter.     Signed, Naseem Adler D Rangel Echeverri, PA-C 02/14/2024, 12:49 PM

## 2024-01-02 LAB — CBC WITH DIFFERENTIAL/PLATELET
Basophils Absolute: 0 10*3/uL (ref 0.0–0.2)
Basos: 0 %
EOS (ABSOLUTE): 0.1 10*3/uL (ref 0.0–0.4)
Eos: 2 %
Hematocrit: 45.3 % (ref 34.0–46.6)
Hemoglobin: 14.6 g/dL (ref 11.1–15.9)
Immature Grans (Abs): 0 10*3/uL (ref 0.0–0.1)
Immature Granulocytes: 0 %
Lymphocytes Absolute: 1.6 10*3/uL (ref 0.7–3.1)
Lymphs: 24 %
MCH: 28.8 pg (ref 26.6–33.0)
MCHC: 32.2 g/dL (ref 31.5–35.7)
MCV: 89 fL (ref 79–97)
Monocytes Absolute: 0.5 10*3/uL (ref 0.1–0.9)
Monocytes: 8 %
Neutrophils Absolute: 4.5 10*3/uL (ref 1.4–7.0)
Neutrophils: 66 %
Platelets: 262 10*3/uL (ref 150–450)
RBC: 5.07 x10E6/uL (ref 3.77–5.28)
RDW: 11.9 % (ref 11.7–15.4)
WBC: 6.9 10*3/uL (ref 3.4–10.8)

## 2024-01-02 LAB — IRON AND TIBC
Iron Saturation: 8 % — CL (ref 15–55)
Iron: 32 ug/dL (ref 27–159)
Total Iron Binding Capacity: 382 ug/dL (ref 250–450)
UIBC: 350 ug/dL (ref 131–425)

## 2024-01-02 LAB — COMPREHENSIVE METABOLIC PANEL WITH GFR
ALT: 14 IU/L (ref 0–32)
AST: 15 IU/L (ref 0–40)
Albumin: 4.5 g/dL (ref 4.0–5.0)
Alkaline Phosphatase: 65 IU/L (ref 44–121)
BUN/Creatinine Ratio: 7 — ABNORMAL LOW (ref 9–23)
BUN: 5 mg/dL — ABNORMAL LOW (ref 6–20)
Bilirubin Total: 0.3 mg/dL (ref 0.0–1.2)
CO2: 16 mmol/L — ABNORMAL LOW (ref 20–29)
Calcium: 9 mg/dL (ref 8.7–10.2)
Chloride: 104 mmol/L (ref 96–106)
Creatinine, Ser: 0.72 mg/dL (ref 0.57–1.00)
Globulin, Total: 2.6 g/dL (ref 1.5–4.5)
Sodium: 137 mmol/L (ref 134–144)
Total Protein: 7.1 g/dL (ref 6.0–8.5)
eGFR: 122 mL/min/{1.73_m2} (ref >59)

## 2024-01-02 LAB — HEMOGLOBIN A1C
Est. average glucose Bld gHb Est-mCnc: 88 mg/dL
Hgb A1c MFr Bld: 4.7 % — ABNORMAL LOW (ref 4.8–5.6)

## 2024-01-02 LAB — URINE CULTURE

## 2024-01-02 LAB — MAGNESIUM: Magnesium: 1.8 mg/dL (ref 1.6–2.3)

## 2024-01-02 LAB — TSH+FREE T4
Free T4: 1.16 ng/dL (ref 0.82–1.77)
TSH: 1.33 u[IU]/mL (ref 0.450–4.500)

## 2024-01-02 LAB — ANA: Anti Nuclear Antibody (ANA): NEGATIVE

## 2024-01-02 LAB — SEDIMENTATION RATE: Sed Rate: 17 mm/h (ref 0–32)

## 2024-01-03 ENCOUNTER — Encounter: Payer: Self-pay | Admitting: Physician Assistant

## 2024-01-04 DIAGNOSIS — R55 Syncope and collapse: Secondary | ICD-10-CM | POA: Insufficient documentation

## 2024-01-05 ENCOUNTER — Other Ambulatory Visit: Payer: Self-pay | Admitting: Physician Assistant

## 2024-01-05 DIAGNOSIS — N390 Urinary tract infection, site not specified: Secondary | ICD-10-CM

## 2024-01-05 MED ORDER — PENICILLIN V POTASSIUM 250 MG PO TABS: 250.0000 mg | ORAL_TABLET | Freq: Four times a day (QID) | ORAL | 0 refills | Status: AC

## 2024-01-08 ENCOUNTER — Ambulatory Visit

## 2024-01-08 VITALS — BP 122/74 | HR 72 | Ht 64.0 in | Wt 144.4 lb

## 2024-01-08 DIAGNOSIS — R55 Syncope and collapse: Secondary | ICD-10-CM | POA: Diagnosis not present

## 2024-01-08 NOTE — Progress Notes (Signed)
 Cardiology Consultation:    Date:  01/08/2024   ID:  Michelle Wiley, DOB [birth year], MRN [id number]  PCP:  Patient, No Pcp Per  Cardiologist:  Luretha Murphy, MD   Referring MD: Chesley Noon, MD   No chief complaint on file.    ASSESSMENT AND PLAN:   Ms. Michelle Wiley 23 year old woman with no significant health issues with reported episodes of syncope/near syncope 3 different times in the past 5 months, symptoms appear consistent with vasovagal episodes.  No significant hemodynamic abnormalities and more recently abnormal urinalysis suggestive of UTI.  No significant abnormal labs or EKG findings. In the setting reassured her about the presentation and possible vasovagal nature of her symptoms.  Problem List Items Addressed This Visit     Syncope and collapse - Primary   Appears vasovagal. Reassured. If any significant symptoms or change in her nature of presentation, can consider further cardiac workup.       Relevant Orders   EKG 12-Lead (Completed)    Return to clinic on an as-needed basis.  History of Present Illness:    Michelle Wiley is a 23 y.o. female who is being seen today for the evaluation of syncope at the request of Chesley Noon, MD.  Pleasant woman without any significant prior medical history.  She is on birth control pills. No significant family history from biological parents available as she is adopted and is unaware of the history.  She gives 3 instances of syncope. The first occurred in November 2024 while she was at a bar in [place], as she stood up to walk she felt lightheaded, sweaty and with muffled heart sounds and vision started becoming tunnel and was helped down to the floor by her friends and as she tried to stand up she felt the same symptoms again and was nauseated.  She was helped back up and went outdoors and felt better within few minutes.  No further episodes until middle of March during spring break while she was seated at a restaurant  started feeling nauseated and upset in her stomach, stood up to go to the restroom and started feeling lightheaded and had to sit down to avoid a fall.  She did end up vomiting.  She did not pass out she subsequently felt this again little later.  Another instance was recently while at home sitting on the couch for an extended time as she stood up to go she felt lightheaded and dizzy and vision started to go dark.  She was able to sit and lay down on the floor.  Believes she briefly passed out after this.  Once she regained consciousness has not had any further symptoms.  But went to the ER at her family's urging and in the ER vitals were noted to be stable.  Urinalysis was reported hazy appearance.  Subsequently on follow-up with PCP thyroid panel TSH and free T4 was normal on 12-31-2023.  She was prescribed antibiotics in the setting of UTI, she is yet to begin this..  Denies any fevers or chills. No other cardiac symptoms.  Functional status.  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  Regularly goes to the gym.  No other cardiac symptoms.  Keeps herself busy with work at [employer], currently doing her majors in Chief Financial Officer and business at [employer].  Does not smoke. No substance abuse. Does not drink sodas. Drinks 1 cup of coffee a day. Does not consume alcohol.  Past Medical History:  Diagnosis Date   Hepatitis B immune 12/29/2020   Formatting of this note might be different from the original.  Titers drawn 10/12/2004     Otitis media 07/10/2022   Plantar wart 12/24/2015   Syncope and collapse     Past Surgical History:  Procedure Laterality Date   FOOT SURGERY Left     Current Medications: Current Meds  Medication Sig   LO LOESTRIN FE 1 MG-10 MCG / 10 MCG tablet Take 1 tablet by mouth daily.   penicillin v potassium (VEETID) 250 MG tablet Take 1 tablet (250 mg total) by mouth 4 (four) times daily for 10 days.     Allergies:    Nitrofurantoin   Social History   Socioeconomic History   Marital status: Single    Spouse name: Not on file   Number of children: Not on file   Years of education: Not on file   Highest education level: Not on file  Occupational History   Not on file  Tobacco Use   Smoking status: Never   Smokeless tobacco: Never  Vaping Use   Vaping status: Never Used  Substance and Sexual Activity   Alcohol use: Not Currently   Drug use: Not Currently   Sexual activity: Not on file  Other Topics Concern   Not on file  Social History Narrative   Not on file   Social Drivers of Health   Financial Resource Strain: Low Risk (04/25/2023)   Received from [facility])   Financial Resource Strain    Sometimes people find that their income does not quite cover their living costs.  In the last 12 months, has this happened to you?: No    What is your current work situation? 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 : Never true  Transportation Needs: Low Risk (04/25/2023)   Received from [facility])   Transportation    Has a lack of transportation kept you from medical appointments, meetings, work, or from getting things needed for daily living. Check all that apply. : No  Physical Activity: Not on file  Stress: Not on file  Social Connections: Not on file     Family History: The patient's family history is not on file. She was adopted. ROS:   Please see the history of present illness.    All 14 point review of systems negative except as described per history of present  illness.  EKGs/Labs/Other Studies Reviewed:    The following studies were reviewed today:   EKG:  EKG Interpretation Date/Time:  Tuesday January 08 2024 15:53:22 EDT Ventricular Rate:  72 PR Interval:  158 QRS Duration:  82 QT Interval:  368 QTC Calculation: 402 R Axis:   83  Text Interpretation: Normal sinus rhythm Cannot rule out Anterior infarct , age undetermined Abnormal ECG When compared with ECG of 26-Dec-2023 12:04, No significant change was found Confirmed by Huntley Dec reddy [phone]) on 01/08/2024 4:25:54 PM    Recent Labs: 12/31/2023: ALT 14; BUN 5; Creatinine, Ser 0.72; Hemoglobin  14.6; Magnesium 1.8; Platelets 262; Potassium CANCELED; Sodium 137; TSH 1.330  Recent Lipid Panel No results found for: "CHOL", "TRIG", "HDL", "CHOLHDL", "VLDL", "LDLCALC", "LDLDIRECT"  Physical Exam:    VS:  BP 122/74   Pulse 72   Ht 5\' 4"  (1.626 m)   Wt 144 lb 6.4 oz (65.5 kg)   LMP 12/19/2023 (Approximate)   SpO2 98%   BMI 24.79 kg/m     Wt Readings from Last 3 Encounters:  01/08/24 144 lb 6.4 oz (65.5 kg)  12/26/23 135 lb (61.2 kg)  02/05/23 136 lb (61.7 kg)     GENERAL:  Well nourished, well developed in no acute distress NECK: No JVD; No carotid bruits CARDIAC: RRR, S1 and S2 present, no murmurs, no rubs, no gallops CHEST:  Clear to auscultation without rales, wheezing or rhonchi  Extremities: No pitting pedal edema. Pulses bilaterally symmetric with radial 2+ and dorsalis pedis 2+ NEUROLOGIC:  Alert and oriented x 3  Medication Adjustments/Labs and Tests Ordered: Current medicines are reviewed at length with the patient today.  Concerns regarding medicines are outlined above.  Orders Placed This Encounter  Procedures   EKG 12-Lead   No orders of the defined types were placed in this encounter.   Signed, Cecille Amsterdam, MD, MPH, [facility]. 01/08/2024 4:46 PM    [facility]

## 2024-01-08 NOTE — Assessment & Plan Note (Signed)
 Appears vasovagal. Reassured. If any significant symptoms or change in her nature of presentation, can consider further cardiac workup.

## 2024-01-08 NOTE — Patient Instructions (Signed)
 Stay hydrated   Medication Instructions:  Your physician recommends that you continue on your current medications as directed. Please refer to the Current Medication list given to you today.  *If you need a refill on your cardiac medications before your next appointment, please call your pharmacy*   Lab Work: None Ordered If you have labs (blood work) drawn today and your tests are completely normal, you will receive your results only by: MyChart Message (if you have MyChart) OR A paper copy in the mail If you have any lab test that is abnormal or we need to change your treatment, we will call you to review the results.   Testing/Procedures: None Ordered   Follow-Up: At [facility], you and your health needs are our priority.  As part of our continuing mission to provide you with exceptional heart care, we have created designated Provider Care Teams.  These Care Teams include your primary Cardiologist (physician) and Advanced Practice Providers (APPs -  Physician Assistants and Nurse Practitioners) who all work together to provide you with the care you need, when you need it.  We recommend signing up for the patient portal called "MyChart".  Sign up information is provided on this After Visit Summary.  MyChart is used to connect with patients for Virtual Visits (Telemedicine).  Patients are able to view lab/test results, encounter notes, upcoming appointments, etc.  Non-urgent messages can be sent to your provider as well.   To learn more about what you can do with MyChart, go to [email or web address].

## 2024-07-15 ENCOUNTER — Ambulatory Visit: Admitting: Family Medicine

## 2024-10-19 ENCOUNTER — Ambulatory Visit: Admitting: Emergency Medicine

## 2024-10-19 VITALS — BP 112/68 | HR 97 | Temp 98.6°F | Ht 64.0 in | Wt 160.0 lb

## 2024-10-19 DIAGNOSIS — R6889 Other general symptoms and signs: Secondary | ICD-10-CM

## 2024-10-19 DIAGNOSIS — J040 Acute laryngitis: Secondary | ICD-10-CM | POA: Diagnosis not present

## 2024-10-19 DIAGNOSIS — J029 Acute pharyngitis, unspecified: Secondary | ICD-10-CM

## 2024-10-19 DIAGNOSIS — R509 Fever, unspecified: Secondary | ICD-10-CM

## 2024-10-19 LAB — POCT RAPID STREP A (OFFICE): Rapid Strep A Screen: NEGATIVE

## 2024-10-19 LAB — POC COVID19/FLU A&B COMBO
Covid Antigen, POC: NEGATIVE
Influenza A Antigen, POC: NEGATIVE
Influenza B Antigen, POC: NEGATIVE

## 2024-10-19 NOTE — Patient Instructions (Signed)
 I recommend alternating tylenol and ibuprofen every 4-5 hours Ibuprofen --  600 mg (three of the 200 mg tablets) Tylenol up to 1,000 mg (two of the extra strength 500 mg tablets)  Mucinex (guaifenesin) twice daily with lots of fluids  Vocal rest, cough drops, salt water gargles, tea with honey

## 2024-10-19 NOTE — Progress Notes (Signed)
 " [facility]     Chief Complaint   Chief Complaint  Patient presents with   Sore Throat    Associated with fatigue, aches and fever. Has not taken any meds today     Patient ID: Michelle Wiley, female    DOB: July 27, 2001  Age: 24 y.o. MRN: [id number]  3 days of body aches, sweats/chills, fever 101, congestion, cough, sore throat. Hoarse voice x 2 days Tolerating fluids. No NVD  Yesterday took advil, mucinex  Friend sick with flu 2 days ago negative home test   In [place] -- flu positive contacts   Past Medical History    Past Medical History:  Diagnosis Date   Hepatitis B immune 12/29/2020   Formatting of this note might be different from the original.  Titers drawn 10/12/2004     Otitis media 07/10/2022   Plantar wart 12/24/2015   Syncope and collapse      Family History  Family History  Adopted: Yes     Social History   Social History[1]   Home Medications    Medications Ordered Prior to Encounter[2]   Allergies    Allergies[3]   Review of Systems  ROS  As per HPI  Physical Exam  Vital Signs  Vitals:   10/19/24 1130  BP: 112/68  Pulse: 97  Temp: 98.6 F (37 C)  SpO2: 99%     Physical Exam Vitals and nursing note reviewed.  Constitutional:      Appearance: She is ill-appearing. She is not toxic-appearing.  HENT:     Right Ear: Tympanic membrane and ear canal normal.     Left Ear: Tympanic membrane and ear canal normal.     Nose: Congestion present. No rhinorrhea.     Mouth/Throat:     Mouth: Mucous membranes are moist.     Pharynx: Oropharynx is clear. No oropharyngeal exudate or posterior oropharyngeal erythema.     Tonsils: 3+ on the right. 3+ on the left.     Comments: Hoarse phonation. Tolerating secretions.  Eyes:     Conjunctiva/sclera: Conjunctivae normal.  Cardiovascular:     Rate and Rhythm: Normal rate and regular rhythm.     Pulses: Normal pulses.     Heart sounds: Normal heart sounds.  Pulmonary:     Effort:  Pulmonary effort is normal.     Breath sounds: Normal breath sounds.  Abdominal:     Palpations: Abdomen is soft.     Tenderness: There is no abdominal tenderness.  Musculoskeletal:     Cervical back: Normal range of motion.  Lymphadenopathy:     Cervical: No cervical adenopathy.  Skin:    General: Skin is warm and dry.  Neurological:     Mental Status: She is alert and oriented to person, place, and time.      Treatments / Results  Labs  Lab Orders         POCT rapid strep A         POC Covid19/Flu A&B Antigen       Results for orders placed or performed in visit on 10/19/24 (from the past 24 hours)  POC Covid19/Flu A&B Antigen     Status: None   Collection Time: 10/19/24 12:11 PM  Result Value Ref Range   Influenza A Antigen, POC Negative Negative   Influenza B Antigen, POC Negative Negative   Covid Antigen, POC Negative Negative  POCT rapid strep A     Status: None   Collection Time: 10/19/24 12:12  PM  Result Value Ref Range   Rapid Strep A Screen Negative Negative     No orders of the defined types were placed in this encounter.    Assessment and Plan  Pertinent lab results that were available during my care of the patient were reviewed by me and considered in my medical decision making (see chart for details).  Currently afebrile Rapid flu and covid negative. Rapid strep negative. Clear lungs. Discussed supportive care for likely viral illness, laryngitis.  Advised OTC options, likely prognosis, return precautions.  Patient is agreeable to plan, no questions Call/message with any concerns   Asberry Searle, PA [facility]     [1]  Social History Tobacco Use   Smoking status: Never   Smokeless tobacco: Never  Vaping Use   Vaping status: Never Used  Substance Use Topics   Alcohol use: Not Currently   Drug use: Not Currently  [2]  Current Outpatient Medications on File Prior to Visit  Medication Sig Dispense Refill   LO LOESTRIN FE 1  MG-10 MCG / 10 MCG tablet Take 1 tablet by mouth daily.     LORazepam (ATIVAN) 0.5 MG tablet Take 0.5 mg by mouth as directed.     No current facility-administered medications on file prior to visit.  [3]  Allergies Allergen Reactions   Nitrofurantoin      Dizziness and chest tightness   "
